# Patient Record
Sex: Male | Born: 1980 | Race: White | Hispanic: No | Marital: Married | Smoking: Current every day smoker
Health system: Southern US, Community
[De-identification: ages and names within clinical notes are randomized; demographics above are authoritative.]

## PROBLEM LIST (undated history)

## (undated) DIAGNOSIS — G44009 Cluster headache syndrome, unspecified, not intractable: Secondary | ICD-10-CM

## (undated) DIAGNOSIS — N200 Calculus of kidney: Secondary | ICD-10-CM

## (undated) DIAGNOSIS — M5481 Occipital neuralgia: Secondary | ICD-10-CM

## (undated) HISTORY — PX: HERNIA REPAIR: SHX51

## (undated) HISTORY — PX: TOOTH EXTRACTION: SUR596

---

## 1998-12-28 ENCOUNTER — Emergency Department (HOSPITAL_COMMUNITY): Admission: EM | Admit: 1998-12-28 | Discharge: 1998-12-28 | Payer: Self-pay | Admitting: Emergency Medicine

## 1998-12-28 ENCOUNTER — Encounter: Payer: Self-pay | Admitting: Emergency Medicine

## 2004-01-10 ENCOUNTER — Emergency Department (HOSPITAL_COMMUNITY): Admission: EM | Admit: 2004-01-10 | Discharge: 2004-01-10 | Payer: Self-pay | Admitting: Internal Medicine

## 2004-05-05 ENCOUNTER — Emergency Department (HOSPITAL_COMMUNITY): Admission: EM | Admit: 2004-05-05 | Discharge: 2004-05-05 | Payer: Self-pay | Admitting: Emergency Medicine

## 2004-10-09 ENCOUNTER — Emergency Department (HOSPITAL_COMMUNITY): Admission: EM | Admit: 2004-10-09 | Discharge: 2004-10-09 | Payer: Self-pay | Admitting: Emergency Medicine

## 2005-01-13 ENCOUNTER — Emergency Department (HOSPITAL_COMMUNITY): Admission: EM | Admit: 2005-01-13 | Discharge: 2005-01-13 | Payer: Self-pay | Admitting: Emergency Medicine

## 2005-03-12 ENCOUNTER — Emergency Department (HOSPITAL_COMMUNITY): Admission: EM | Admit: 2005-03-12 | Discharge: 2005-03-12 | Payer: Self-pay | Admitting: Emergency Medicine

## 2005-03-14 ENCOUNTER — Emergency Department (HOSPITAL_COMMUNITY): Admission: EM | Admit: 2005-03-14 | Discharge: 2005-03-14 | Payer: Self-pay | Admitting: Emergency Medicine

## 2005-09-11 ENCOUNTER — Emergency Department (HOSPITAL_COMMUNITY): Admission: EM | Admit: 2005-09-11 | Discharge: 2005-09-11 | Payer: Self-pay | Admitting: Emergency Medicine

## 2005-11-28 ENCOUNTER — Emergency Department (HOSPITAL_COMMUNITY): Admission: EM | Admit: 2005-11-28 | Discharge: 2005-11-28 | Payer: Self-pay | Admitting: Emergency Medicine

## 2006-01-27 ENCOUNTER — Emergency Department (HOSPITAL_COMMUNITY): Admission: EM | Admit: 2006-01-27 | Discharge: 2006-01-27 | Payer: Self-pay | Admitting: Emergency Medicine

## 2006-05-12 ENCOUNTER — Emergency Department (HOSPITAL_COMMUNITY): Admission: EM | Admit: 2006-05-12 | Discharge: 2006-05-12 | Payer: Self-pay | Admitting: Emergency Medicine

## 2006-11-26 IMAGING — CR DG HAND COMPLETE 3+V*R*
2 series · 2 of 2 positions shown · non-contrast
Comparison: none

CLINICAL DATA: Pain and swelling after injury this morning. 
 RIGHT HAND - 3 VIEW ? 01/27/06:

[view not recorded (1 of 2)]
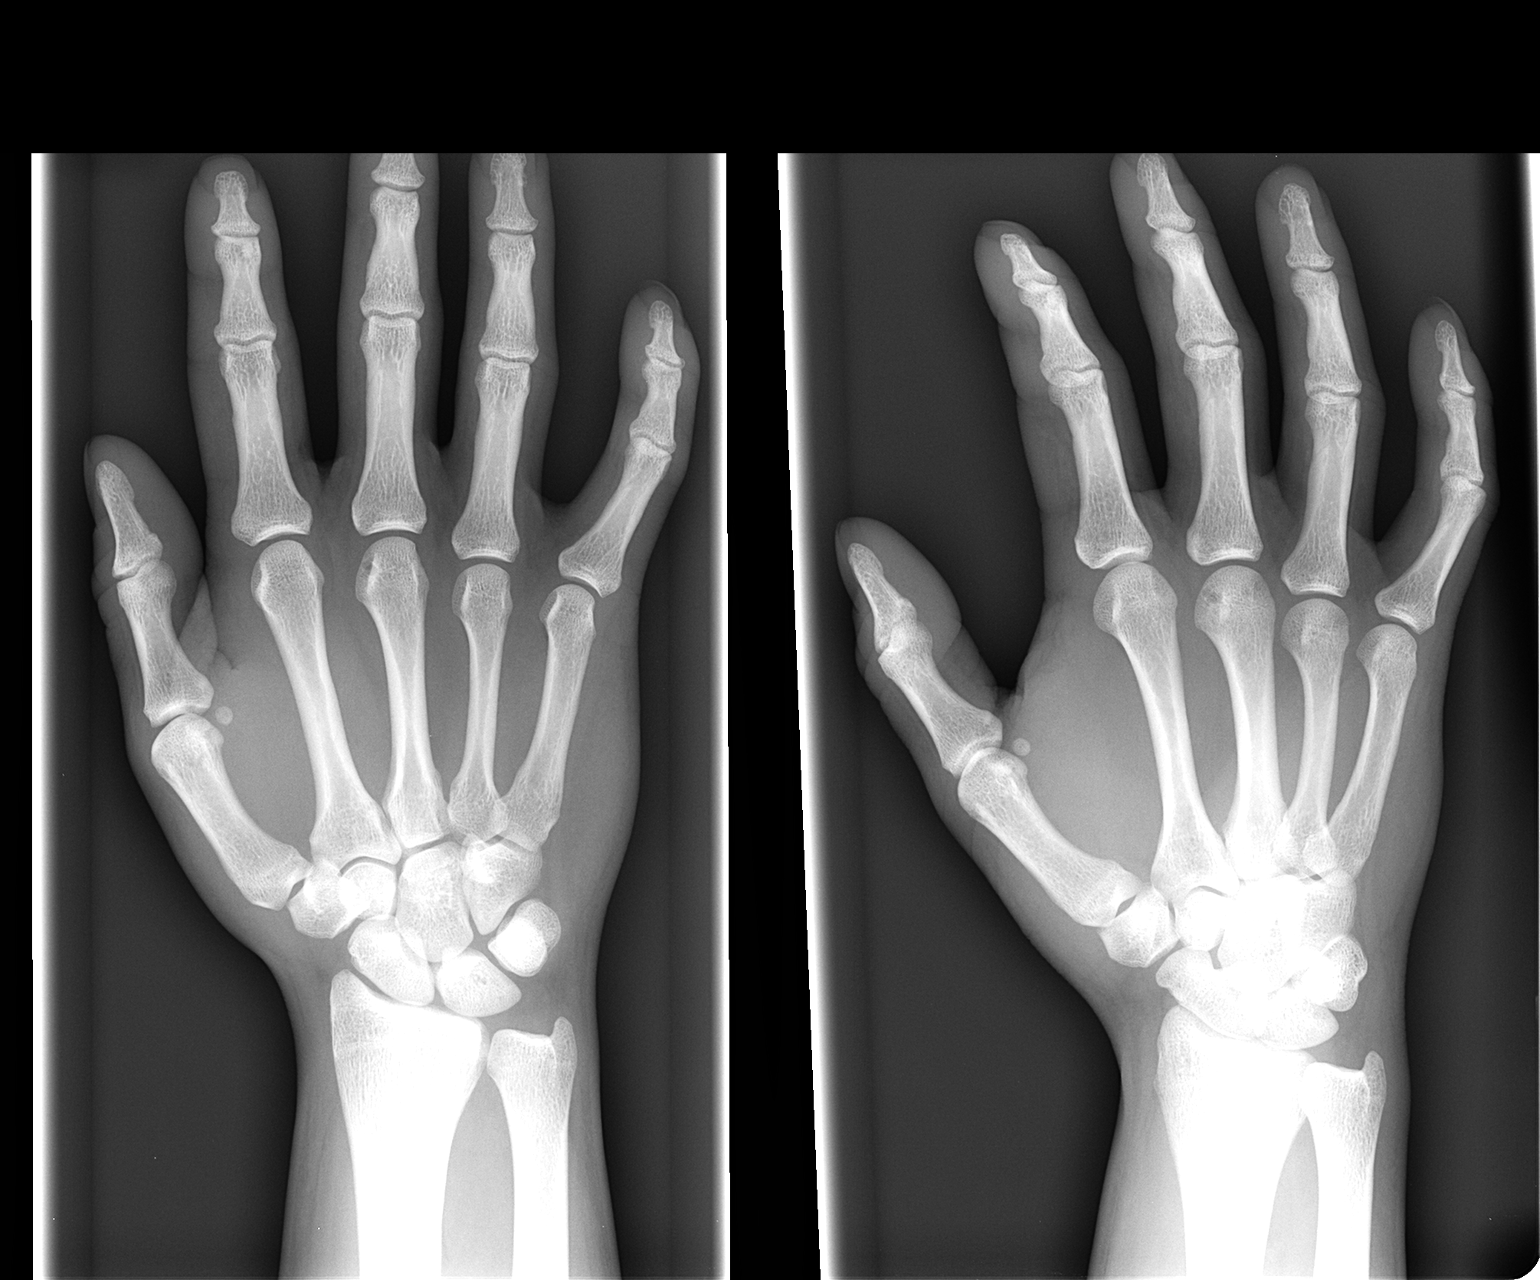

[view not recorded (2 of 2)]
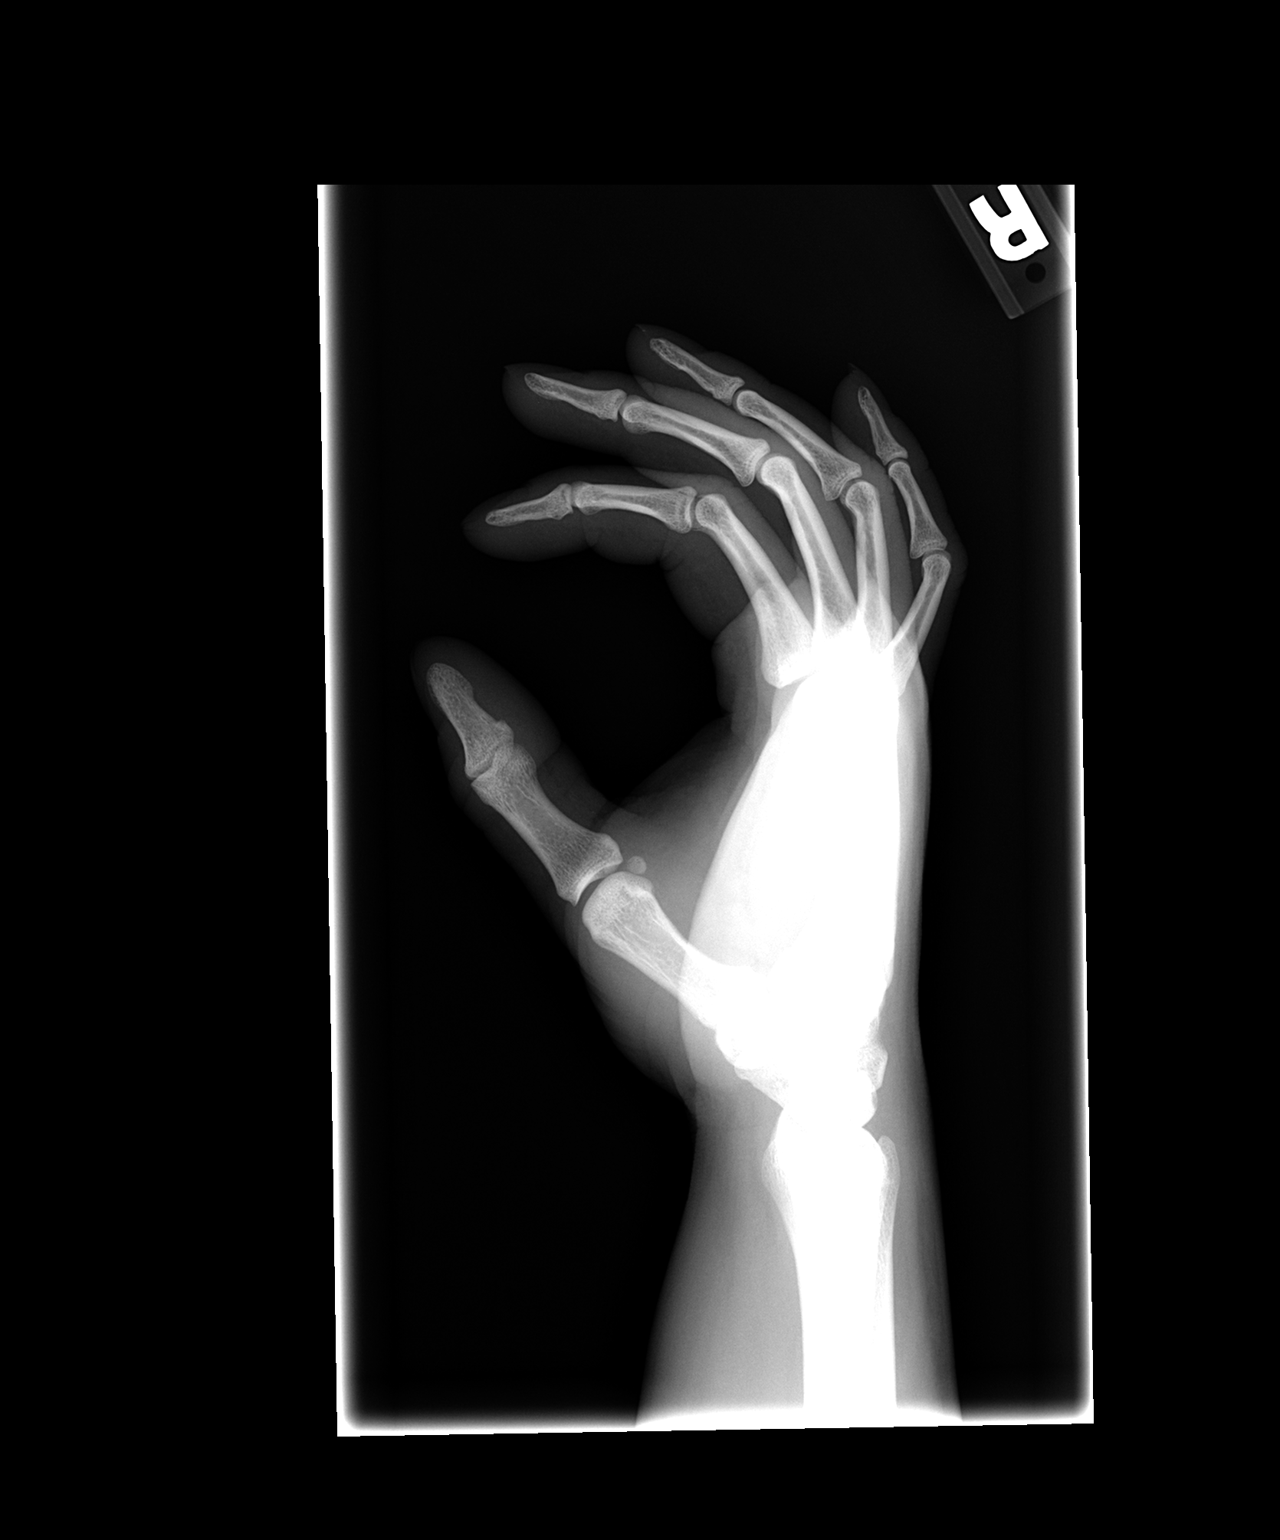

[2 of 2 positions shown; findings below may reference images not displayed]

FINDINGS: No fracture or joint abnormality.  No radiopaque foreign body.
IMPRESSION: No fracture or joint abnormality.

## 2011-03-07 ENCOUNTER — Emergency Department (HOSPITAL_COMMUNITY)
Admission: EM | Admit: 2011-03-07 | Discharge: 2011-03-07 | Discharge status: Against Medical Advice | Payer: Medicaid Other | Attending: Emergency Medicine | Admitting: Emergency Medicine

## 2011-03-07 DIAGNOSIS — R209 Unspecified disturbances of skin sensation: Secondary | ICD-10-CM | POA: Insufficient documentation

## 2011-03-07 DIAGNOSIS — R5381 Other malaise: Secondary | ICD-10-CM | POA: Insufficient documentation

## 2011-03-07 DIAGNOSIS — F411 Generalized anxiety disorder: Secondary | ICD-10-CM | POA: Insufficient documentation

## 2011-03-07 LAB — RAPID URINE DRUG SCREEN, HOSP PERFORMED
Amphetamines: NOT DETECTED
Benzodiazepines: NOT DETECTED
Opiates: NOT DETECTED

## 2012-07-21 DIAGNOSIS — R51 Headache: Secondary | ICD-10-CM

## 2012-07-25 ENCOUNTER — Encounter (HOSPITAL_COMMUNITY): Payer: Self-pay | Admitting: Emergency Medicine

## 2012-07-25 ENCOUNTER — Emergency Department (HOSPITAL_COMMUNITY)
Admission: EM | Admit: 2012-07-25 | Discharge: 2012-07-25 | Disposition: A | Discharge status: Home / Self Care | Payer: Medicaid Other | Attending: Emergency Medicine | Admitting: Emergency Medicine

## 2012-07-25 ENCOUNTER — Emergency Department (HOSPITAL_COMMUNITY): Payer: Medicaid Other

## 2012-07-25 DIAGNOSIS — N390 Urinary tract infection, site not specified: Secondary | ICD-10-CM | POA: Insufficient documentation

## 2012-07-25 DIAGNOSIS — M545 Low back pain, unspecified: Secondary | ICD-10-CM | POA: Insufficient documentation

## 2012-07-25 DIAGNOSIS — Z87442 Personal history of urinary calculi: Secondary | ICD-10-CM | POA: Insufficient documentation

## 2012-07-25 HISTORY — DX: Calculus of kidney: N20.0

## 2012-07-25 LAB — URINALYSIS, ROUTINE W REFLEX MICROSCOPIC
Ketones, ur: NEGATIVE mg/dL
Nitrite: POSITIVE — AB
pH: 7 (ref 5.0–8.0)

## 2012-07-25 MED ORDER — CIPROFLOXACIN HCL 250 MG PO TABS
500.0000 mg | ORAL_TABLET | Freq: Once | ORAL | Status: AC
  Administered 2012-07-25: 500 mg via ORAL
  Filled 2012-07-25: qty 2

## 2012-07-25 MED ORDER — ONDANSETRON HCL 4 MG/2ML IJ SOLN
4.0000 mg | Freq: Once | INTRAMUSCULAR | Status: AC
  Administered 2012-07-25: 4 mg via INTRAVENOUS
  Filled 2012-07-25: qty 2

## 2012-07-25 MED ORDER — HYDROCODONE-ACETAMINOPHEN 5-325 MG PO TABS: 1.0000 | ORAL_TABLET | Freq: Four times a day (QID) | ORAL | Status: AC | PRN

## 2012-07-25 MED ORDER — KETOROLAC TROMETHAMINE 30 MG/ML IJ SOLN
30.0000 mg | Freq: Once | INTRAMUSCULAR | Status: AC
  Administered 2012-07-25: 30 mg via INTRAVENOUS
  Filled 2012-07-25: qty 1

## 2012-07-25 MED ORDER — HYDROMORPHONE HCL PF 1 MG/ML IJ SOLN
1.0000 mg | Freq: Once | INTRAMUSCULAR | Status: AC
  Administered 2012-07-25: 1 mg via INTRAVENOUS
  Filled 2012-07-25: qty 1

## 2012-07-25 MED ORDER — CIPROFLOXACIN HCL 500 MG PO TABS: 500.0000 mg | ORAL_TABLET | Freq: Two times a day (BID) | ORAL | Status: DC

## 2012-07-25 MED ORDER — SODIUM CHLORIDE 0.9 % IV SOLN
1000.0000 mL | INTRAVENOUS | Status: DC
  Administered 2012-07-25: 1000 mL via INTRAVENOUS

## 2012-07-25 MED ORDER — SODIUM CHLORIDE 0.9 % IV SOLN
1000.0000 mL | Freq: Once | INTRAVENOUS | Status: AC
  Administered 2012-07-25: 1000 mL via INTRAVENOUS

## 2012-07-25 NOTE — ED Provider Notes (Signed)
History     CSN: [id number]  Arrival date & time 07/25/12  0905   First MD Initiated Contact with Patient 07/25/12 [phone]      Chief Complaint  Patient presents with  . Flank Pain    left  . Back Pain    (Consider location/radiation/quality/duration/timing/severity/associated sxs/prior treatment) HPI Comments: Pt states that he has been having B lower back and B posterior flank pain ~ 1 month.  Worse in past 24 hrs.  No visible blood in urine.  Has had 1 previous kidney stone ~ 5 yrs ago.  No fever or chills.  No UTI sxs other than hesitation.    No known back injury.  Admits some pain with movement.  The history is provided by the patient. No language interpreter was used.    Past Medical History  Diagnosis Date  . Kidney stones     Past Surgical History  Procedure Date  . Tooth extraction     No family history on file.  History  Substance Use Topics  . Smoking status: Former Games developer  . Smokeless tobacco: Not on file  . Alcohol Use: No      Review of Systems  Constitutional: Negative for fever and chills.  Genitourinary: Negative for dysuria, urgency, frequency, hematuria, discharge and testicular pain.  Musculoskeletal: Positive for back pain.  All other systems reviewed and are negative.    Allergies  Review of patient's allergies indicates no known allergies.  Home Medications   Current Outpatient Rx  Name Route Sig Dispense Refill  . ALPRAZOLAM 1 MG PO TABS Oral Take 1 mg by mouth 4 (four) times daily as needed. FOR ANXIETY    . PAROXETINE HCL 20 MG PO TABS Oral Take 40 mg by mouth every morning.    Marland Kitchen CIPROFLOXACIN HCL 500 MG PO TABS Oral Take 1 tablet (500 mg total) by mouth 2 (two) times daily. 14 tablet 0  . HYDROCODONE-ACETAMINOPHEN 5-325 MG PO TABS Oral Take 1 tablet by mouth every 6 (six) hours as needed for pain. 20 tablet 0    BP 141/90  Pulse 90  Temp 97.9 F (36.6 C) (Oral)  Resp 18  Ht 5\' 4"  (1.626 m)  Wt 182 lb (82.555 kg)  BMI  31.24 kg/m2  SpO2 100%  Physical Exam  Nursing note and vitals reviewed. Constitutional: He is oriented to person, place, and time. He appears well-developed and well-nourished.  HENT:  Head: Normocephalic and atraumatic.  Eyes: EOM are normal.  Neck: Normal range of motion.  Cardiovascular: Normal rate, regular rhythm, normal heart sounds and intact distal pulses.   Pulmonary/Chest: Effort normal and breath sounds normal. No respiratory distress.  Abdominal: Soft. He exhibits no distension. There is no tenderness. There is CVA tenderness.  Musculoskeletal: He exhibits tenderness.       Lumbar back: He exhibits decreased range of motion, tenderness and pain.       Back:  Neurological: He is alert and oriented to person, place, and time.  Skin: Skin is warm and dry.  Psychiatric: He has a normal mood and affect. Judgment normal.    ED Course  Procedures (including critical care time)  Labs Reviewed  URINALYSIS, ROUTINE W REFLEX MICROSCOPIC - Abnormal; Notable for the following:    Hgb urine dipstick LARGE (*)     Nitrite POSITIVE (*)     Leukocytes, UA MODERATE (*)     All other components within normal limits  URINE MICROSCOPIC-ADD ON  URINE CULTURE   Ct  Abdomen Pelvis Wo Contrast  07/25/2012  *RADIOLOGY REPORT*  Clinical Data: Bilateral back and flank pain.  CT ABDOMEN AND PELVIS WITHOUT CONTRAST  Technique:  Multidetector CT imaging of the abdomen and pelvis was performed following the standard protocol without intravenous contrast.  Comparison: No priors.  Findings:  Lung Bases: Unremarkable.  Abdomen/Pelvis:  There are no calcifications within the collecting system of either kidney, along the course of either ureter, or within the lumen of the urinary bladder to suggest the presence of urinary tract calculi.  Additionally, there is no evidence of hydroureteronephrosis or perinephric stranding at this time to suggest urinary tract obstruction.  The unenhanced appearance of the  liver, gallbladder, pancreas, spleen and bilateral adrenal glands is unremarkable.  Normal appendix.  No ascites or pneumoperitoneum and no pathologic distension of small bowel.  No definite pathologic lymphadenopathy identified within the abdomen or pelvis on this noncontrast CT examination.  Urinary bladder is unremarkable in appearance.  Musculoskeletal: There are no aggressive appearing lytic or blastic lesions noted in the visualized portions of the skeleton.  IMPRESSION: 1.  No acute findings in the abdomen or pelvis to account for the patient's symptoms.  Specifically, no evidence of abnormal urinary tract calculi or findings of urinary tract obstruction at this time. 2.  Normal appendix.   Original Report Authenticated By: Florencia Reasons, M.D.      1. UTI (urinary tract infection)   2. Low back pain       MDM  rx-cipro 500 mg , 14 rx-hydrocodone F/u with dr. Jerre Simon If not improving after tx of UTI f/u with dr. Hilda Lias or Romeo Apple.        Evalina Field, PA 07/25/12 1255

## 2012-07-25 NOTE — ED Notes (Addendum)
Pt reports that he had a "numb spot" on the left side of his back that started x1 month. Pt reports that it worsened and is now painful on both sides of his back. Pt reports that he has a hx of kidney stones. Pt reports it hurts worse with a deep breath and is sensitive to touch. Pt reports sleeplessness,denies any blood, or burning with urination. Pt denies v/d. Pt reports constant nausea. Pt states that he had an abx px at home from a tooth extraction a few months ago that he has been taking to try and help current problem with no relief. NAD noted.

## 2012-07-25 NOTE — ED Provider Notes (Signed)
Medical screening examination/treatment/procedure(s) were performed by non-physician practitioner and as supervising physician I was immediately available for consultation/collaboration.   Charles B. Bernette Mayers, MD 07/25/12 1300

## 2012-07-26 LAB — URINE CULTURE: Culture: NO GROWTH

## 2013-05-24 IMAGING — CT CT ABD-PELV W/O CM
2 of 3 series · 9 of 46 positions shown, 11 images · non-contrast
Comparison: No priors.

CLINICAL DATA: Bilateral back and flank pain.

CT ABDOMEN AND PELVIS WITHOUT CONTRAST
TECHNIQUE: Multidetector CT imaging of the abdomen and pelvis was
performed following the standard protocol without intravenous
contrast.

[Series 4: mpr coronal (id) · coronal · 0.92mm/px · 8 of 81 slices shown, 9 images]
[im 9/81  soft-tissue]
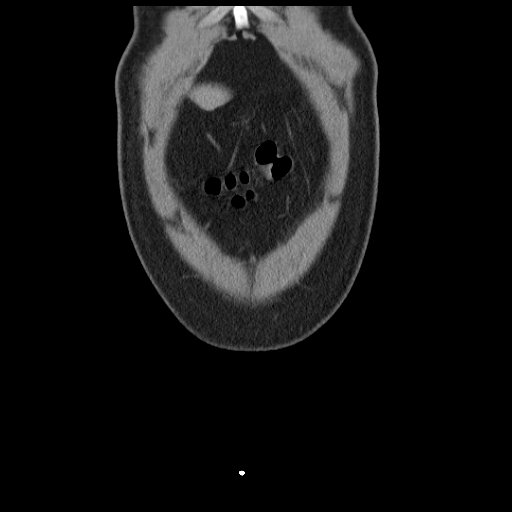
[im 9/81  bone]
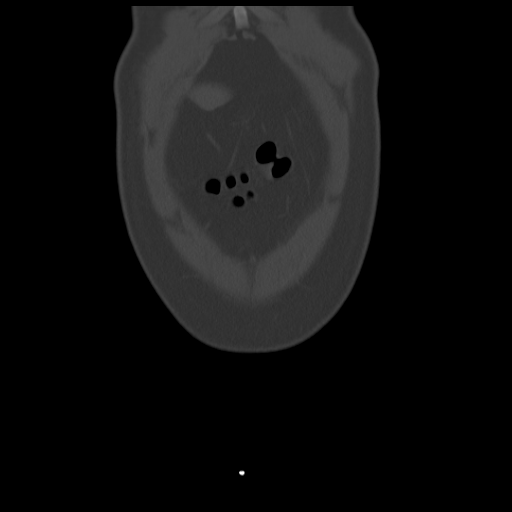
[im 18/81  soft-tissue]
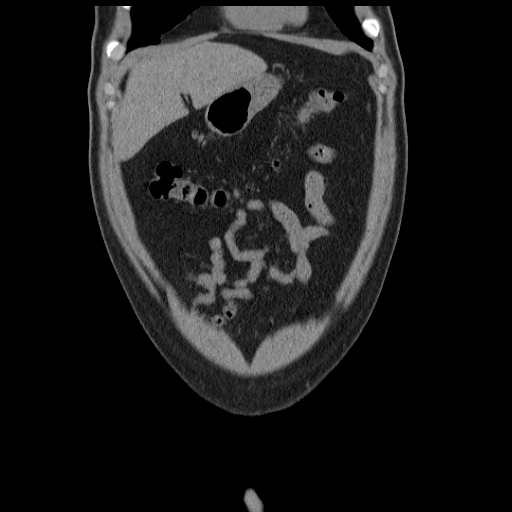
[im 27/81  soft-tissue]
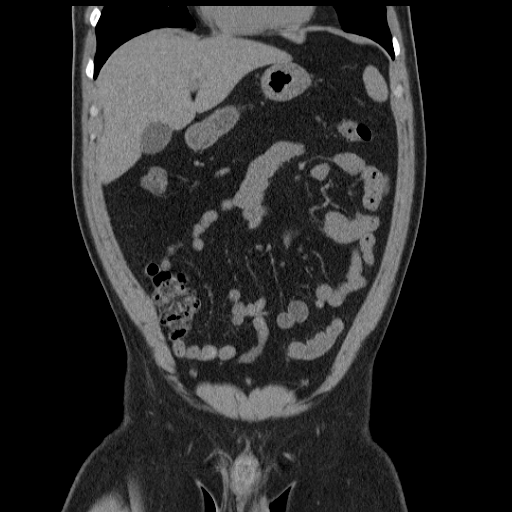
[im 36/81  soft-tissue]
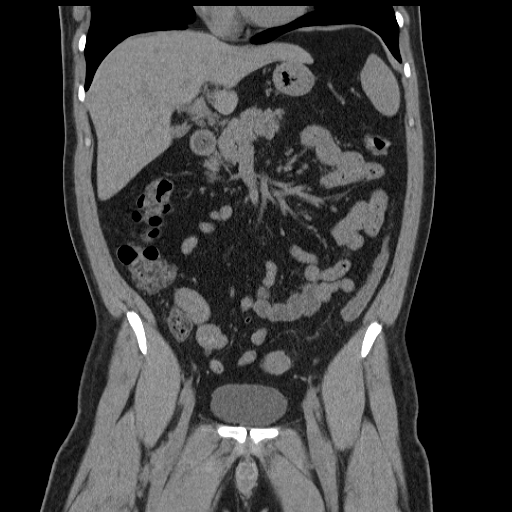
[im 45/81  soft-tissue]
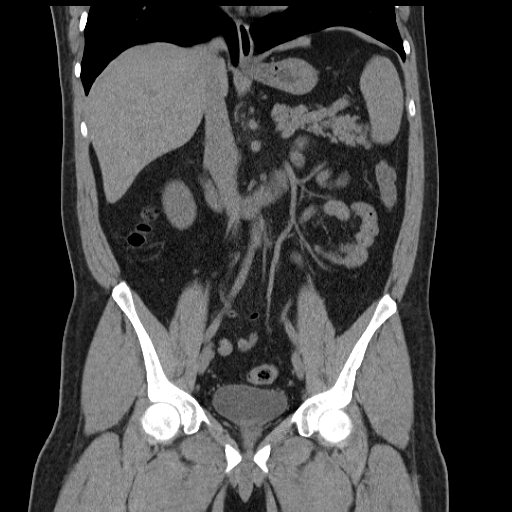
[im 54/81  soft-tissue]
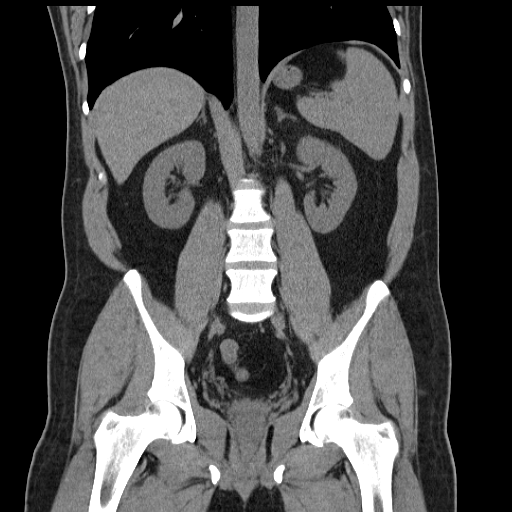
[im 63/81  soft-tissue]
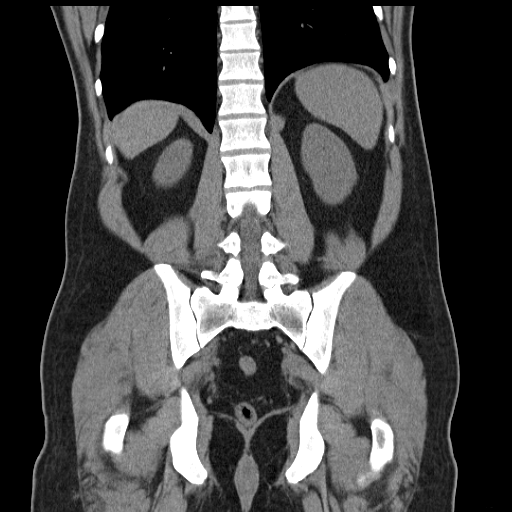
[im 72/81  soft-tissue]
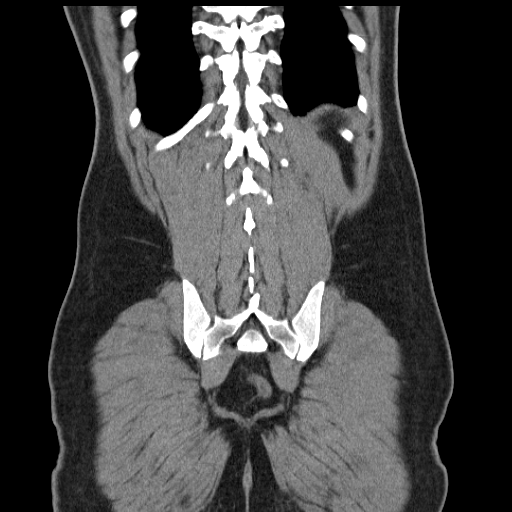

[Series 5: mpr sagittal (id) · sagittal · 0.62mm/px · 1 of 114 slices shown, 2 images]
[im 38/114  soft-tissue]
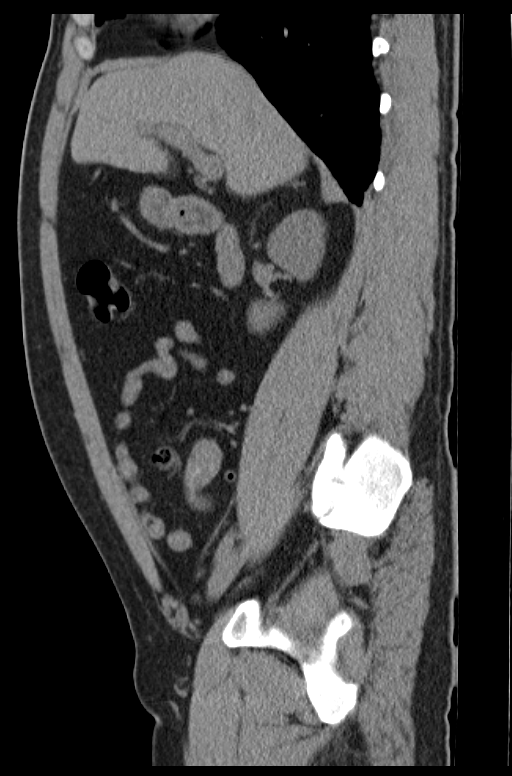
[im 38/114  bone]
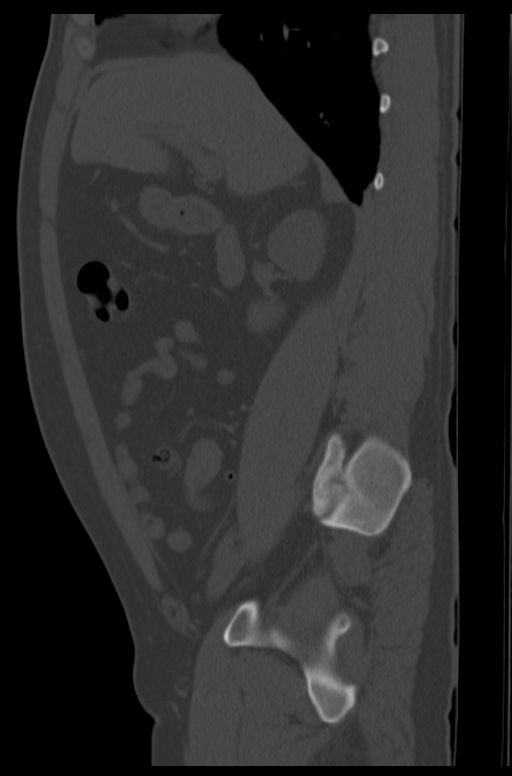

[9 of 46 positions shown; findings below may reference images not displayed]

FINDINGS: Lung Bases: Unremarkable.

Abdomen/Pelvis:  There are no calcifications within the collecting
system of either kidney, along the course of either ureter, or
within the lumen of the urinary bladder to suggest the presence of
urinary tract calculi.  Additionally, there is no evidence of
hydroureteronephrosis or perinephric stranding at this time to
suggest urinary tract obstruction.

The unenhanced appearance of the liver, gallbladder, pancreas,
spleen and bilateral adrenal glands is unremarkable.  Normal
appendix.  No ascites or pneumoperitoneum and no pathologic
distension of small bowel.  No definite pathologic lymphadenopathy
identified within the abdomen or pelvis on this noncontrast CT
examination.  Urinary bladder is unremarkable in appearance.

Musculoskeletal: There are no aggressive appearing lytic or blastic
lesions noted in the visualized portions of the skeleton.
IMPRESSION: 1.  No acute findings in the abdomen or pelvis to account for the
patient's symptoms.  Specifically, no evidence of abnormal urinary
tract calculi or findings of urinary tract obstruction at this
time.
2.  Normal appendix.

## 2013-05-31 ENCOUNTER — Ambulatory Visit: Payer: Self-pay

## 2013-10-26 ENCOUNTER — Encounter (HOSPITAL_COMMUNITY): Payer: Self-pay | Admitting: Emergency Medicine

## 2013-10-26 ENCOUNTER — Emergency Department (HOSPITAL_COMMUNITY)
Admission: EM | Admit: 2013-10-26 | Discharge: 2013-10-26 | Disposition: A | Discharge status: Home / Self Care | Payer: Medicaid Other | Attending: Emergency Medicine | Admitting: Emergency Medicine

## 2013-10-26 DIAGNOSIS — M531 Cervicobrachial syndrome: Secondary | ICD-10-CM | POA: Insufficient documentation

## 2013-10-26 DIAGNOSIS — R52 Pain, unspecified: Secondary | ICD-10-CM | POA: Insufficient documentation

## 2013-10-26 DIAGNOSIS — Z87442 Personal history of urinary calculi: Secondary | ICD-10-CM | POA: Insufficient documentation

## 2013-10-26 DIAGNOSIS — M5481 Occipital neuralgia: Secondary | ICD-10-CM

## 2013-10-26 DIAGNOSIS — Z792 Long term (current) use of antibiotics: Secondary | ICD-10-CM | POA: Insufficient documentation

## 2013-10-26 DIAGNOSIS — F17200 Nicotine dependence, unspecified, uncomplicated: Secondary | ICD-10-CM | POA: Insufficient documentation

## 2013-10-26 HISTORY — DX: Occipital neuralgia: M54.81

## 2013-10-26 HISTORY — DX: Cluster headache syndrome, unspecified, not intractable: G44.009

## 2013-10-26 MED ORDER — HYDROMORPHONE HCL PF 2 MG/ML IJ SOLN
2.0000 mg | Freq: Once | INTRAMUSCULAR | Status: AC
  Administered 2013-10-26: 2 mg via INTRAMUSCULAR
  Filled 2013-10-26: qty 1

## 2013-10-26 MED ORDER — CYCLOBENZAPRINE HCL 10 MG PO TABS: 10.0000 mg | ORAL_TABLET | Freq: Three times a day (TID) | ORAL | Status: DC | PRN

## 2013-10-26 MED ORDER — GABAPENTIN 600 MG PO TABS
600.0000 mg | ORAL_TABLET | Freq: Two times a day (BID) | ORAL | Status: AC
Start: 2013-10-26 — End: ?

## 2013-10-26 MED ORDER — OXYCODONE-ACETAMINOPHEN 5-325 MG PO TABS: 2.0000 | ORAL_TABLET | ORAL | Status: DC | PRN

## 2013-10-26 MED ORDER — KETOROLAC TROMETHAMINE 60 MG/2ML IM SOLN
60.0000 mg | Freq: Once | INTRAMUSCULAR | Status: AC
  Administered 2013-10-26: 60 mg via INTRAMUSCULAR
  Filled 2013-10-26: qty 2

## 2013-10-26 NOTE — Discharge Instructions (Signed)
Neurontin, Percocet, and Flexeril as prescribed as needed for pain.  Followup with your primary Dr. as scheduled in 10 days to discuss whether or not to continue these medications.   Occipital Neuralgia Neuralgias are attacks of sharp stabbing pain. They may be intermittent (comes and goes) or constant in nature. They may be brief attacks that last seconds to minutes and may come back for days to weeks. The neuralgias can occur as a result of a herpes zoster (shingles), chickenpox infection, or even following a herpes simplex infection (cold sore). TYPES OF NEURALGIA  When these pains are located in the back of the head and neck they are called occipital neuralgias.  When the pain is located between ribs it is called intercostal neuralgia.  When the pain is located in the face it is called trigeminal neuralgia. This is the most common neuralgia. It causes sharp, shock like pain on one side of your face. The neuralgias, which follow herpes zoster infections, often produce a constant burning pain. They may last from weeks to months and even years. The attacks of pain may come from injury or inflammation (irritation) to a nerve. Often the cause is unknown. The episodes of pain may be caused by light touch, movement, or even eating and sneezing. Usually these neuralgias occur after age 42forty. The neuralgias following shingles and trigeminal neuralgia are the most common. Although painful, these episodes do not threaten life and tend to lessen as we grow older. TREATMENT  There are many medications that may be helpful in the treatment of this disorder. Sometimes several medications may have to be tried before the right combination can be found for you. Some of these medications are:  Only take over-the-counter or prescription medications for pain, discomfort, or fever as directed by your caregiver.  Narcotic medications may be used to control the pain.  Antidepressants and medications used in  epilepsy (seizure disorders) may be useful. LET YOUR CAREGIVER KNOW ABOUT:  If you do not obtain relief from medications.  Problems that are getting worse rather than better.  Troubling side effects that you think are coming from the medication. Do not be discouraged if you do not obtain instant relief from the medications or help given you. Your caregiver can help you get through these episodes of pain with some persistence (continued trying) on your part also. Document Released: 09/24/2001 Document Revised: 12/23/2011 Document Reviewed: 09/30/2005 [facility] Patient Information 2014 [employer], [place].

## 2013-10-26 NOTE — ED Notes (Signed)
MD at bedside. 

## 2013-10-26 NOTE — ED Provider Notes (Signed)
CSN: [id number]     Arrival date & time 10/26/13  1725 History   First MD Initiated Contact with Patient 10/26/13 1855     Chief Complaint  Patient presents with  . Headache   (Consider location/radiation/quality/duration/timing/severity/associated sxs/prior Treatment) HPI Comments: Patient is a 33 year old male with history of occipital neuralgia diagnosed approximately 3 years ago. He states he has been having intermittent flareups of this since that time. [place] was [place] in [place]. He does not have a primary doctor and has been off the medications he normally takes to prevent this. He denies any new injury or trauma. He denies any visual changes, fever, or stiff neck  Patient is a 33 y.o. male presenting with headaches. The history is provided by the patient.  Headache Pain location:  Occipital Quality:  Sharp and stabbing Radiates to: Top of head. Onset quality:  Sudden Timing:  Intermittent Progression:  Worsening Chronicity:  Chronic Similar to prior headaches: yes   Context: not activity and not exposure to bright light     Past Medical History  Diagnosis Date  . Kidney stones   . Cluster headaches   . Occipital neuralgia    Past Surgical History  Procedure Laterality Date  . Tooth extraction    . Hernia repair     No family history on file. History  Substance Use Topics  . Smoking status: Current Every Day Smoker    Types: Cigarettes  . Smokeless tobacco: Not on file  . Alcohol Use: No    Review of Systems  Neurological: Positive for headaches.  All other systems reviewed and are negative.    Allergies  Review of patient's allergies indicates no known allergies.  Home Medications   Current Outpatient Rx  Name  Route  Sig  Dispense  Refill  . ALPRAZolam (XANAX) 1 MG tablet   Oral   Take 1 mg by mouth 4 (four) times daily as needed. FOR ANXIETY         . ciprofloxacin (CIPRO) 500 MG  tablet   Oral   Take 1 tablet (500 mg total) by mouth 2 (two) times daily.   14 tablet   0   . PARoxetine (PAXIL) 20 MG tablet   Oral   Take 40 mg by mouth every morning.          BP 122/102  Pulse 78  Temp(Src) 97.9 F (36.6 C) (Oral)  Resp 16  Ht 5\' 7"  (1.702 m)  Wt 174 lb (78.926 kg)  BMI 27.25 kg/m2  SpO2 98% Physical Exam  Nursing note and vitals reviewed. Constitutional: He is oriented to person, place, and time. He appears well-developed and well-nourished. No distress.  HENT:  Head: Normocephalic and atraumatic.  Mouth/Throat: Oropharynx is clear and moist.  Neck: Normal range of motion. Neck supple.  Cardiovascular: Normal rate, regular rhythm and normal heart sounds.   No murmur heard. Pulmonary/Chest: Effort normal and breath sounds normal. No respiratory distress. He has no wheezes.  Abdominal: Soft. Bowel sounds are normal. He exhibits no distension. There is no tenderness.  Musculoskeletal: Normal range of motion. He exhibits no edema.  Neurological: He is alert and oriented to person, place, and time. No cranial nerve deficit. He exhibits normal muscle tone. Coordination normal.  Skin: Skin is warm and dry. He is not diaphoretic.    ED Course  Procedures (including critical care time) Labs Review Labs Reviewed - No data to display  Imaging Review No results found.    MDM  No diagnosis found. We'll treat his acute pain and restart his medications that he was on before. I see no focal neurologic deficits and no indication for CT or LP.    Geoffery Lyons, MD 10/26/13 [phone]

## 2013-10-26 NOTE — ED Notes (Signed)
Headache x 4 days.  Denies n/v.  Denies visual disturbances.

## 2024-08-27 ENCOUNTER — Other Ambulatory Visit: Payer: Self-pay

## 2024-08-27 ENCOUNTER — Encounter (HOSPITAL_COMMUNITY): Payer: Self-pay | Admitting: Emergency Medicine

## 2024-08-27 ENCOUNTER — Emergency Department (HOSPITAL_COMMUNITY)
Admission: EM | Admit: 2024-08-27 | Discharge: 2024-08-27 | Disposition: A | Discharge status: Home / Self Care | Attending: Emergency Medicine | Admitting: Emergency Medicine

## 2024-08-27 ENCOUNTER — Emergency Department (HOSPITAL_COMMUNITY)

## 2024-08-27 DIAGNOSIS — K279 Peptic ulcer, site unspecified, unspecified as acute or chronic, without hemorrhage or perforation: Secondary | ICD-10-CM | POA: Insufficient documentation

## 2024-08-27 DIAGNOSIS — R739 Hyperglycemia, unspecified: Secondary | ICD-10-CM | POA: Insufficient documentation

## 2024-08-27 DIAGNOSIS — R1013 Epigastric pain: Secondary | ICD-10-CM | POA: Diagnosis present

## 2024-08-27 LAB — URINALYSIS, ROUTINE W REFLEX MICROSCOPIC
Bilirubin Urine: NEGATIVE
Glucose, UA: NEGATIVE mg/dL
Hgb urine dipstick: NEGATIVE
Ketones, ur: NEGATIVE mg/dL
Leukocytes,Ua: NEGATIVE
Nitrite: NEGATIVE
Protein, ur: NEGATIVE mg/dL
Specific Gravity, Urine: 1.019 (ref 1.005–1.030)
pH: 6 (ref 5.0–8.0)

## 2024-08-27 LAB — COMPREHENSIVE METABOLIC PANEL WITH GFR
ALT: 13 U/L (ref 0–44)
AST: 16 U/L (ref 15–41)
Albumin: 5 g/dL (ref 3.5–5.0)
Alkaline Phosphatase: 72 U/L (ref 38–126)
Anion gap: 12 (ref 5–15)
BUN: 8 mg/dL (ref 6–20)
CO2: 27 mmol/L (ref 22–32)
Calcium: 9.4 mg/dL (ref 8.9–10.3)
Chloride: 98 mmol/L (ref 98–111)
Creatinine, Ser: 0.91 mg/dL (ref 0.61–1.24)
GFR, Estimated: >60 mL/min (ref >60)
Glucose, Bld: 132 mg/dL — ABNORMAL HIGH (ref 70–99)
Potassium: 3.7 mmol/L (ref 3.5–5.1)
Sodium: 136 mmol/L (ref 135–145)
Total Bilirubin: 0.4 mg/dL (ref 0.0–1.2)
Total Protein: 7.7 g/dL (ref 6.5–8.1)

## 2024-08-27 LAB — CBC
HCT: 45.4 % (ref 39.0–52.0)
Hemoglobin: 15.7 g/dL (ref 13.0–17.0)
MCH: 29.3 pg (ref 26.0–34.0)
MCHC: 34.6 g/dL (ref 30.0–36.0)
MCV: 84.7 fL (ref 80.0–100.0)
Platelets: 380 K/uL (ref 150–400)
RBC: 5.36 MIL/uL (ref 4.22–5.81)
RDW: 12.2 % (ref 11.5–15.5)
WBC: 8.5 K/uL (ref 4.0–10.5)
nRBC: 0 % (ref 0.0–0.2)

## 2024-08-27 LAB — LIPASE, BLOOD: Lipase: 37 U/L (ref 11–51)

## 2024-08-27 MED ORDER — OXYCODONE-ACETAMINOPHEN 5-325 MG PO TABS: 1.0000 | ORAL_TABLET | ORAL | 0 refills | Status: AC | PRN

## 2024-08-27 MED ORDER — HYDROMORPHONE HCL 1 MG/ML IJ SOLN
1.0000 mg | Freq: Once | INTRAMUSCULAR | Status: AC
  Administered 2024-08-27: 1 mg via INTRAVENOUS
  Filled 2024-08-27: qty 1

## 2024-08-27 MED ORDER — OXYCODONE-ACETAMINOPHEN 5-325 MG PO TABS: 1.0000 | ORAL_TABLET | ORAL | 0 refills | Status: DC | PRN

## 2024-08-27 MED ORDER — FAMOTIDINE IN NACL 20-0.9 MG/50ML-% IV SOLN
20.0000 mg | Freq: Once | INTRAVENOUS | Status: AC
  Administered 2024-08-27: 20 mg via INTRAVENOUS
  Filled 2024-08-27: qty 50

## 2024-08-27 MED ORDER — PANTOPRAZOLE SODIUM 40 MG PO TBEC: 40.0000 mg | DELAYED_RELEASE_TABLET | Freq: Every day | ORAL | 2 refills | Status: DC

## 2024-08-27 MED ORDER — IOHEXOL 300 MG/ML  SOLN
100.0000 mL | Freq: Once | INTRAMUSCULAR | Status: AC | PRN
  Administered 2024-08-27: 100 mL via INTRAVENOUS

## 2024-08-27 NOTE — ED Triage Notes (Signed)
 Pt c/o RUQ pain for the past 2 weeks. Pt states the pain has only increased.

## 2024-08-27 NOTE — Discharge Instructions (Addendum)
 Avoid spicy, fried and fatty foods.  Stop smoking.

## 2024-08-28 NOTE — ED Provider Notes (Signed)
 [facility] Provider Note   CSN: [id number] Arrival date & time: 08/27/24  1927     Patient presents with: Abdominal Pain   Todd Woodard is a 43 y.o. male.   Patient complains of upper abdominal pain.  Patient reports that he has had discomfort on and off for several weeks.  He reports the pain has become very severe over the last 2 weeks.  Patient reports he is having difficulty eating.  Patient reports that he has a stabbing pain in his upper abdomen.  Patient has tried over-the-counter medications without relief.  Patient points to the right upper quadrant and the mid epigastric area as the location of most of his pain.  Patient states pain radiates through to his back.  Patient reports he has nausea.  Patient reports eating causes increased pain.  Patient denies any diarrhea he has not been exposed to anyone with any type of viral illness.  The history is provided by the patient and the spouse.  Abdominal Pain Pain location:  Epigastric, RUQ and LUQ Pain quality: aching and shooting   Pain radiates to:  Back Duration:  2 weeks Timing:  Constant Chronicity:  New Relieved by:  Nothing Worsened by:  Nothing Ineffective treatments:  None tried Associated symptoms: nausea        Prior to Admission medications   Medication Sig Start Date End Date Taking? Authorizing Provider  ALPRAZolam (XANAX) 1 MG tablet Take 1 mg by mouth 4 (four) times daily as needed. FOR ANXIETY    [provider]  ciprofloxacin  (CIPRO ) 500 MG tablet Take 1 tablet (500 mg total) by mouth 2 (two) times daily. 07/25/12   Cleotilde Charlie HERO, PA-C  cyclobenzaprine  (FLEXERIL ) 10 MG tablet Take 1 tablet (10 mg total) by mouth 3 (three) times daily as needed for muscle spasms. 10/26/13   Geroldine Berg, MD  gabapentin  (NEURONTIN ) 600 MG tablet Take 1 tablet (600 mg total) by mouth 2 (two) times daily. 10/26/13   Geroldine Berg, MD  oxyCODONE -acetaminophen   (PERCOCET) 5-325 MG tablet Take 1 tablet by mouth every 4 (four) hours as needed for up to 4 days for severe pain (pain score 7-10). 08/27/24 08/31/24  Christina Waldrop K, PA-C  pantoprazole (PROTONIX) 40 MG tablet Take 1 tablet (40 mg total) by mouth daily. 08/27/24 08/27/25  Arriyanna Mersch K, PA-C  PARoxetine (PAXIL) 20 MG tablet Take 40 mg by mouth every morning.    [provider]    Allergies: Patient has no known allergies.    Review of Systems  Gastrointestinal:  Positive for abdominal pain and nausea.  All other systems reviewed and are negative.   Updated Vital Signs BP 122/88   Pulse 73   Temp 98.4 F (36.9 C)   Resp 16   Ht 5' 7 (1.702 m)   Wt 78.9 kg   SpO2 98%   BMI 27.24 kg/m   Physical Exam Vitals and nursing note reviewed.  Constitutional:      Appearance: He is well-developed.  HENT:     Head: Normocephalic.  Cardiovascular:     Rate and Rhythm: Normal rate.  Pulmonary:     Effort: Pulmonary effort is normal.  Abdominal:     General: Abdomen is flat. Bowel sounds are normal. There is no distension.     Tenderness: There is abdominal tenderness.  Musculoskeletal:        General: Normal range of motion.     Cervical back: Normal  range of motion.  Skin:    General: Skin is warm.  Neurological:     General: No focal deficit present.     Mental Status: He is alert and oriented to person, place, and time.     (all labs ordered are listed, but only abnormal results are displayed) Labs Reviewed  COMPREHENSIVE METABOLIC PANEL WITH GFR - Abnormal; Notable for the following components:      Result Value   Glucose, Bld 132 (*)    All other components within normal limits  LIPASE, BLOOD  CBC  URINALYSIS, ROUTINE W REFLEX MICROSCOPIC    EKG: None  Radiology: CT ABDOMEN PELVIS W CONTRAST Result Date: 08/27/2024 CLINICAL DATA:  Right upper quadrant pain EXAM: CT ABDOMEN AND PELVIS WITH CONTRAST TECHNIQUE: Multidetector CT imaging of the abdomen  and pelvis was performed using the standard protocol following bolus administration of intravenous contrast. RADIATION DOSE REDUCTION: This exam was performed according to the departmental dose-optimization program which includes automated exposure control, adjustment of the mA and/or kV according to patient size and/or use of iterative reconstruction technique. CONTRAST:  OMNIPAQUE IOHEXOL 300 MG/ML  SOLN COMPARISON:  CT 07/25/2012, 04/26/2021 report FINDINGS: Lower chest: Lung bases demonstrate no acute airspace disease. Hepatobiliary: No focal liver abnormality is seen. No gallstones, gallbladder wall thickening, or biliary dilatation. Pancreas: Unremarkable. No pancreatic ductal dilatation or surrounding inflammatory changes. Spleen: Normal in size without focal abnormality. Adrenals/Urinary Tract: Adrenal glands are unremarkable. Kidneys are normal, without renal calculi, focal lesion, or hydronephrosis. Bladder is unremarkable. Stomach/Bowel: Marked wall thickening of the gastric antrum and pylorus mild surrounding fat stranding. No extraluminal gas. Remainder of the small bowel is nondistended. No acute bowel wall thickening. Mild diverticular disease of the left colon. Vascular/Lymphatic: No significant vascular findings are present. No enlarged abdominal or pelvic lymph nodes. Reproductive: Prostate is unremarkable. Other: Negative for ascites or free air Musculoskeletal: No acute or suspicious osseous abnormality IMPRESSION: 1. Marked wall thickening of the antral pyloric region of the stomach with mild surrounding fat stranding, findings are suspicious for gastritis/peptic ulcer disease. Neoplasm not excluded. Correlation with endoscopy recommended if not already performed. No extraluminal gas to suggest perforation. 2. Mild diverticular disease of the left colon without acute inflammatory process. Electronically Signed   By: Luke Bun M.D.   On: 08/27/2024 21:32     Procedures    Medications Ordered in the ED  iohexol (OMNIPAQUE) 300 MG/ML solution 100 mL (100 mLs Intravenous Contrast Given 08/27/24 2107)  HYDROmorphone  (DILAUDID ) injection 1 mg (1 mg Intravenous Given 08/27/24 2149)  famotidine (PEPCID) IVPB 20 mg premix (0 mg Intravenous Stopped 08/27/24 2254)                                    Medical Decision Making Patient complains of upper abdominal pain.  Patient reports he has a sharp stabbing pain that goes through to his back.  Patient reports the pain is worse with eating.  Amount and/or Complexity of Data Reviewed Labs: ordered. Decision-making details documented in ED Course.    Details: Labs ordered reviewed and interpreted patient's glucose is elevated at 132.  White blood cell count is normal lipase is normal Radiology: ordered and independent interpretation performed. Decision-making details documented in ED Course.    Details: CT abdomen and pelvis ordered reviewed and interpreted.  Radiologist reports marked wall thickening of the antral pyloric region of the stomach with mild  surrounding fat stranding suspicious for gastric peptic ulcer disease radiologist advised this should be correlated with endoscopy.  Risk Prescription drug management. Risk Details: Patient is advised of need for follow-up with gastroenterology.  Patient is advised of gastric wall thickening.  He is advised that radiology has advised that he should have a endoscopy.  I will start him on Protonix.  Patient is given a prescription for Percocet to help with his pain.  Patient is counseled on the need for dietary changes as well as to stop smoking.       Final diagnoses:  Peptic ulcer disease    ED Discharge Orders          Ordered    pantoprazole (PROTONIX) 40 MG tablet  Daily,   Status:  Discontinued        08/27/24 2302    oxyCODONE -acetaminophen  (PERCOCET) 5-325 MG tablet  Every 4 hours PRN,   Status:  Discontinued        08/27/24 2302    pantoprazole  (PROTONIX) 40 MG tablet  Daily        08/27/24 2310    oxyCODONE -acetaminophen  (PERCOCET) 5-325 MG tablet  Every 4 hours PRN        08/27/24 2310            An After Visit Summary was printed and given to the patient.    Letha Mirabal K, PA-C 08/28/24 2311    Towana Ozell BROCKS, MD 08/29/24 [phone]

## 2024-09-28 ENCOUNTER — Telehealth (INDEPENDENT_AMBULATORY_CARE_PROVIDER_SITE_OTHER): Payer: Self-pay | Admitting: Gastroenterology

## 2024-09-28 ENCOUNTER — Encounter (INDEPENDENT_AMBULATORY_CARE_PROVIDER_SITE_OTHER): Payer: Self-pay | Admitting: Gastroenterology

## 2024-09-28 ENCOUNTER — Ambulatory Visit (INDEPENDENT_AMBULATORY_CARE_PROVIDER_SITE_OTHER): Admitting: Gastroenterology

## 2024-09-28 ENCOUNTER — Other Ambulatory Visit (HOSPITAL_COMMUNITY)
Admission: RE | Admit: 2024-09-28 | Discharge: 2024-09-28 | Disposition: A | Source: Ambulatory Visit | Attending: Gastroenterology | Admitting: Gastroenterology

## 2024-09-28 VITALS — BP 138/70 | HR 89 | Temp 97.6°F | Ht 66.0 in | Wt 166.6 lb

## 2024-09-28 DIAGNOSIS — K279 Peptic ulcer, site unspecified, unspecified as acute or chronic, without hemorrhage or perforation: Secondary | ICD-10-CM | POA: Insufficient documentation

## 2024-09-28 DIAGNOSIS — R10816 Epigastric abdominal tenderness: Secondary | ICD-10-CM | POA: Diagnosis not present

## 2024-09-28 DIAGNOSIS — R933 Abnormal findings on diagnostic imaging of other parts of digestive tract: Secondary | ICD-10-CM | POA: Diagnosis not present

## 2024-09-28 DIAGNOSIS — R935 Abnormal findings on diagnostic imaging of other abdominal regions, including retroperitoneum: Secondary | ICD-10-CM | POA: Insufficient documentation

## 2024-09-28 DIAGNOSIS — Z1211 Encounter for screening for malignant neoplasm of colon: Secondary | ICD-10-CM | POA: Insufficient documentation

## 2024-09-28 DIAGNOSIS — F17210 Nicotine dependence, cigarettes, uncomplicated: Secondary | ICD-10-CM | POA: Diagnosis not present

## 2024-09-28 MED ORDER — PANTOPRAZOLE SODIUM 40 MG PO TBEC: 40.0000 mg | DELAYED_RELEASE_TABLET | Freq: Two times a day (BID) | ORAL | 2 refills | Status: AC

## 2024-09-28 MED ORDER — VOQUEZNA 20 MG PO TABS: 20.0000 mg | ORAL_TABLET | Freq: Every day | ORAL | Status: AC

## 2024-09-28 NOTE — Addendum Note (Signed)
 Addended by: Karia Ehresman on: 09/28/2024 11:51 AM   Modules accepted: Orders

## 2024-09-28 NOTE — Telephone Encounter (Signed)
 Medication Samples have been provided to the patient.  Drug name: Voquezna        Strength: 20 mg        Qty: 2 boxes   LOT: [id number]  Exp.Date: 07/2025  Dosing instructions: take one tablet po daily  The patient has been instructed regarding the correct time, dose, and frequency of taking this medication, including desired effects and most common side effects.   Todd Woodard 11:50 AM 09/28/2024

## 2024-09-28 NOTE — Patient Instructions (Signed)
 It was very nice to meet you today, as dicussed with will plan for the following :  1) Stool sample first  2) Voquenza for 1 week than Protonix  40mg  twice daily

## 2024-09-28 NOTE — Progress Notes (Signed)
 Todd Woodard , M.D. Gastroenterology & Hepatology [facility] Gastroenterology [address] [place], [place] [address] Primary Care Physician: Patient, No Pcp Per No address on file  Chief Complaint: Abnormal CT with gastric wall thickening/ulcer, colon cancer screening  History of Present Illness: Todd Woodard is a 43 y.o. male with history of anxiety who presents for evaluation of Abnormal CT with gastric wall thickening/ulcer, colon cancer screening  Patient reports for past 2 to 3 months he has epigastric pain which diffusely radiates with make the patient wake up in the middle of the night.  He denies any NSAIDs.  Continues to smoking daily Patient had a ER visit 08/27/2024 for abdominal pain and had an abnormal CT abdomen at that time The patient denies having any fever, chills, hematochezia, melena, hematemesis, diarrhea, jaundice, pruritus or weight loss.  Patient takes Gaviscon but seem to help with his pain  Last ZHI:wnwz Last Colonoscopy:none  FHx: neg for any gastrointestinal/liver disease, no malignancies Social: Daily cigarette Surgical: Hernia repair  Labs 10/25/2023 normal liver enzymes hemoglobin 15.7 platelets 380 Past Medical History: Past Medical History:  Diagnosis Date   Cluster headaches    Kidney stones    Occipital neuralgia     Past Surgical History: Past Surgical History:  Procedure Laterality Date   HERNIA REPAIR     TOOTH EXTRACTION      Family History:No family history on file.  Social History:Tobacco Use History[1] Social History   Substance and Sexual Activity  Alcohol Use No   Social History   Substance and Sexual Activity  Drug Use No    Allergies: Allergies[2]  Medications: Current Outpatient Medications  Medication Sig Dispense Refill   ALPRAZolam (XANAX) 1 MG tablet Take 1 mg by mouth 4 (four) times daily as needed. FOR ANXIETY     gabapentin  (NEURONTIN ) 600 MG tablet Take 1 tablet  (600 mg total) by mouth 2 (two) times daily. 30 tablet 0   pantoprazole  (PROTONIX ) 40 MG tablet Take 1 tablet (40 mg total) by mouth daily. 30 tablet 2   PARoxetine (PAXIL) 20 MG tablet Take 40 mg by mouth every morning.     No current facility-administered medications for this visit.    Review of Systems: GENERAL: negative for malaise, night sweats HEENT: No changes in hearing or vision, no nose bleeds or other nasal problems. NECK: Negative for lumps, goiter, pain and significant neck swelling RESPIRATORY: Negative for cough, wheezing CARDIOVASCULAR: Negative for chest pain, leg swelling, palpitations, orthopnea GI: SEE HPI MUSCULOSKELETAL: Negative for joint pain or swelling, back pain, and muscle pain. SKIN: Negative for lesions, rash HEMATOLOGY Negative for prolonged bleeding, bruising easily, and swollen nodes. ENDOCRINE: Negative for cold or heat intolerance, polyuria, polydipsia and goiter. NEURO: negative for tremor, gait imbalance, syncope and seizures. The remainder of the review of systems is noncontributory.   Physical Exam: There were no vitals taken for this visit. GENERAL: The patient is AO x3, in no acute distress. HEENT: Head is normocephalic and atraumatic. EOMI are intact. Mouth is well hydrated and without lesions. NECK: Supple. No masses LUNGS: Clear to auscultation. No presence of rhonchi/wheezing/rales. Adequate chest expansion HEART: RRR, normal s1 and s2. ABDOMEN: Soft, epigastric tenderness, no guarding, no peritoneal signs, and nondistended. BS +. No masses.  Imaging/Labs: as above     Latest Ref Rng & Units 08/27/2024    7:42 PM  CBC  WBC 4.0 - 10.5 K/uL 8.5   Hemoglobin 13.0 - 17.0 g/dL 15.7  Hematocrit 39.0 - 52.0 % 45.4   Platelets 150 - 400 K/uL 380    No results found for: IRON, TIBC, FERRITIN  I personally reviewed and interpreted the available labs, imaging and endoscopic files.  CT 08/2024   IMPRESSION: 1. Marked wall  thickening of the antral pyloric region of the stomach with mild surrounding fat stranding, findings are suspicious for gastritis/peptic ulcer disease. Neoplasm not excluded. Correlation with endoscopy recommended if not already performed. No extraluminal gas to suggest perforation. 2. Mild diverticular disease of the left colon without acute inflammatory process.  Impression and Plan: Todd Woodard is a 43 y.o. male with history of anxiety who presents for evaluation of Abnormal CT with gastric wall thickening/ulcer, colon cancer screening  #Abnormal Ct  # Abdominal tenderness  Patient has CT finding of gastric antral wall thickening suspicious for peptic ulcer disease but cannot rule out malignancy without upper endoscopy  On exam today has epigastric tenderness.  Denies any NSAIDs for multiple questioning  Continues to smoke cigarettes  Submit stool sample for H. pylori patient currently is not on any acid suppression therapy  Will give Voquezna   20 mg tablet daily for a week, to be switched to pantoprazole  40 mg twice daily thereafter  Upper endoscopy Advised smoking cessation  #Colon cancer screening  The patient was counseled regarding the importance of colorectal cancer screening, particularly starting at age 41 due to the rising incidence of colorectal cancer in younger individuals. The benefits of screening include early detection of colorectal cancer and precancerous polyps, which can improve treatment outcomes and reduce mortality. Risks associated with screening, particularly colonoscopy, include potential complications such as bleeding and perforation. After deciding different modalities for screening for colon cancer , patient has opted to pursue Colonoscopy   All questions were answered.      Todd Criado Faizan Ronan Dion, MD Gastroenterology and Hepatology [facility] Gastroenterology   This chart has been completed using [facility] Dictation software,  and while attempts have been made to ensure accuracy , certain words and phrases may not be transcribed as intended      [1]  Social History Tobacco Use  Smoking Status Every Day   Types: Cigarettes  Smokeless Tobacco Not on file  [2] No Known Allergies

## 2024-09-29 ENCOUNTER — Telehealth (INDEPENDENT_AMBULATORY_CARE_PROVIDER_SITE_OTHER): Payer: Self-pay

## 2024-09-29 NOTE — Telephone Encounter (Signed)
 ATC patient, could not get the call to go through. Called wife's listed home number and left a voicemail.
